# Patient Record
Sex: Male | Born: 1975 | Race: Black or African American | Hispanic: No | Marital: Single | Smoking: Former smoker
Health system: Southern US, Community
[De-identification: ages and names within clinical notes are randomized; demographics above are authoritative.]

## PROBLEM LIST (undated history)

## (undated) DIAGNOSIS — I10 Essential (primary) hypertension: Secondary | ICD-10-CM

## (undated) HISTORY — PX: NO PAST SURGERIES: SHX2092

---

## 2015-02-01 ENCOUNTER — Emergency Department
Admit: 2015-02-01 | Disposition: A | Discharge status: Home / Self Care | Payer: Self-pay | Admitting: Emergency Medicine

## 2015-02-01 LAB — CBC
HCT: 48.4 % (ref 40.0–52.0)
HGB: 16.3 g/dL (ref 13.0–18.0)
MCH: 30.2 pg (ref 26.0–34.0)
MCHC: 33.6 g/dL (ref 32.0–36.0)
MCV: 90 fL (ref 80–100)
Platelet: 189 10*3/uL (ref 150–440)
RBC: 5.39 10*6/uL (ref 4.40–5.90)
RDW: 12.9 % (ref 11.5–14.5)
WBC: 5.6 10*3/uL (ref 3.8–10.6)

## 2015-02-01 LAB — BASIC METABOLIC PANEL
Anion Gap: 8 (ref 7–16)
BUN: 14 mg/dL
CHLORIDE: 101 mmol/L
Calcium, Total: 9.3 mg/dL
Co2: 28 mmol/L
Creatinine: 1.03 mg/dL
EGFR (African American): >60
Glucose: 118 mg/dL — ABNORMAL HIGH
Potassium: 3.1 mmol/L — ABNORMAL LOW
SODIUM: 137 mmol/L

## 2015-02-01 LAB — TROPONIN I

## 2018-02-18 ENCOUNTER — Ambulatory Visit: Payer: Self-pay | Admitting: Urology

## 2019-05-10 ENCOUNTER — Encounter: Payer: Self-pay | Admitting: Emergency Medicine

## 2019-05-10 ENCOUNTER — Other Ambulatory Visit: Payer: Self-pay

## 2019-05-10 ENCOUNTER — Ambulatory Visit
Admission: EM | Admit: 2019-05-10 | Discharge: 2019-05-10 | Disposition: A | Discharge status: Home / Self Care | Payer: BC Managed Care – PPO | Attending: Family Medicine | Admitting: Family Medicine

## 2019-05-10 DIAGNOSIS — R0602 Shortness of breath: Secondary | ICD-10-CM

## 2019-05-10 DIAGNOSIS — I10 Essential (primary) hypertension: Secondary | ICD-10-CM | POA: Diagnosis not present

## 2019-05-10 HISTORY — DX: Essential (primary) hypertension: I10

## 2019-05-10 NOTE — Discharge Instructions (Signed)
Follow up as needed if symptoms recur Stop Sudafed decongestant

## 2019-05-10 NOTE — ED Triage Notes (Signed)
Pt states that he had a Tdap yesterday at his CPE. He woke up about 2:30 this morning SOB, arm swollen and aching. He is not having any shortness of breath now and had none since this morning. Denies chest pain.

## 2019-05-10 NOTE — ED Provider Notes (Signed)
[facility]    CSN: [id number] Arrival date & time: 05/10/19  1310     History   Chief Complaint Chief Complaint  Patient presents with  . Arm Pain    HPI Alvin Nichols is a 43 y.o. male.   43 yo male with a c/o an episode of shortness of breath last night. States shortness of breath is resolved and feels ok now. States he received a Tdap vaccine yesterday and wanted to make sure it was not a reaction to that. Denies any fevers, chills, chest pains, palpitations, swelling, hives/rash.    Arm Pain    Past Medical History:  Diagnosis Date  . Hypertension     There are no active problems to display for this patient.   Past Surgical History:  Procedure Laterality Date  . NO PAST SURGERIES         Home Medications    Prior to Admission medications   Medication Sig Start Date End Date Taking? Authorizing Provider  amLODipine (NORVASC) 10 MG tablet Take 10 mg by mouth daily. 04/11/19  Yes [provider]  fexofenadine-pseudoephedrine (ALLEGRA-D) 60-120 MG 12 hr tablet Take by mouth. 05/09/19 05/08/20 Yes [provider]  hydrochlorothiazide (HYDRODIURIL) 12.5 MG tablet Take 12.5 mg by mouth daily. 04/26/19  Yes [provider]  Multiple Vitamin (MULTI-VITAMIN) tablet Take by mouth.   Yes [provider]    Family History Family History  Problem Relation Age of Onset  . Healthy Mother   . Heart attack Father     Social History Social History   Tobacco Use  . Smoking status: Former Research scientist (life sciences)  . Smokeless tobacco: Never Used  Substance Use Topics  . Alcohol use: Yes  . Drug use: Not Currently     Allergies   Patient has no known allergies.   Review of Systems Review of Systems   Physical Exam Triage Vital Signs ED Triage Vitals  Enc Vitals Group     BP 05/10/19 1324 (!) 165/96     Pulse Rate 05/10/19 1324 82     Resp 05/10/19 1324 18     Temp 05/10/19 1324 98.1 F (36.7 C)     Temp Source 05/10/19  1324 Oral     SpO2 05/10/19 1324 100 %     Weight 05/10/19 1320 257 lb (116.6 kg)     Height 05/10/19 1320 6' (1.829 m)     Head Circumference --      Peak Flow --      Pain Score 05/10/19 1320 0     Pain Loc --      Pain Edu? --      Excl. in [place]? --    No data found.  Updated Vital Signs BP (!) 165/96 (BP Location: Left Arm)   Pulse 82   Temp 98.1 F (36.7 C) (Oral)   Resp 18   Ht 6' (1.829 m)   Wt 116.6 kg   SpO2 100%   BMI 34.86 kg/m   Visual Acuity Right Eye Distance:   Left Eye Distance:   Bilateral Distance:    Right Eye Near:   Left Eye Near:    Bilateral Near:     Physical Exam Vitals signs and nursing note reviewed.  Constitutional:      General: He is not in acute distress.    Appearance: He is not ill-appearing, toxic-appearing or diaphoretic.  Cardiovascular:     Rate and Rhythm: Normal rate.     Pulses:  Normal pulses.     Heart sounds: Normal heart sounds.  Pulmonary:     Effort: Pulmonary effort is normal. No respiratory distress.     Breath sounds: Normal breath sounds. No stridor. No wheezing, rhonchi or rales.  Skin:    Comments: Mild tenderness and mild edema at right upper arm injection site; extremity neurovascularly intact  Neurological:     Mental Status: He is alert.      UC Treatments / Results  Labs (all labs ordered are listed, but only abnormal results are displayed) Labs Reviewed - No data to display  EKG   Radiology No results found.  Procedures Procedures (including critical care time)  Medications Ordered in UC Medications - No data to display  Initial Impression / Assessment and Plan / UC Course  I have reviewed the triage vital signs and the nursing notes.  Pertinent labs & imaging results that were available during my care of the patient were reviewed by me and considered in my medical decision making (see chart for details).      Final Clinical Impressions(s) / UC Diagnoses   Final diagnoses:   Shortness of breath  Essential hypertension     Discharge Instructions     Follow up as needed if symptoms recur Stop Sudafed decongestant    ED Prescriptions    None     1. diagnosis reviewed with patien 3. Recommend supportive treatment as above 3. Follow-up prn if symptoms worsen or don't improve   Controlled Substance Prescriptions [place] Controlled Substance Registry consulted? Not Applicable   Payton Mccallumonty, Illyana Schorsch, MD 05/10/19 1520

## 2020-01-08 ENCOUNTER — Ambulatory Visit: Payer: 59 | Attending: Internal Medicine

## 2020-01-08 DIAGNOSIS — Z23 Encounter for immunization: Secondary | ICD-10-CM

## 2020-01-08 NOTE — Progress Notes (Signed)
   Covid-19 Vaccination Clinic  Name:  Alvin Nichols    MRN: [id number] DOB: [birth year]  01/08/2020  [name] was observed post Covid-19 immunization for 15 minutes without incident. He was provided with Vaccine Information Sheet and instruction to access the V-Safe system.   [name] was instructed to call 911 with any severe reactions post vaccine: Marland Kitchen Difficulty breathing  . Swelling of face and throat  . A fast heartbeat  . A bad rash all over body  . Dizziness and weakness   Immunizations Administered    Name Date Dose VIS Date Route   Pfizer COVID-19 Vaccine 01/08/2020  2:31 PM 0.3 mL 09/22/2019 Intramuscular   Manufacturer: [employer], [employer]   Lot: [id number]   NDC: [address]-9

## 2020-02-06 ENCOUNTER — Ambulatory Visit: Payer: 59 | Attending: Internal Medicine

## 2020-02-06 DIAGNOSIS — Z23 Encounter for immunization: Secondary | ICD-10-CM

## 2020-02-06 NOTE — Progress Notes (Signed)
   Covid-19 Vaccination Clinic  Name:  BIRD SWETZ    MRN: [id number] DOB: December 11, 1975  02/06/2020  Mr. Lancour was observed post Covid-19 immunization for 15 minutes without incident. He was provided with Vaccine Information Sheet and instruction to access the V-Safe system.   Mr. Kier was instructed to call 911 with any severe reactions post vaccine: Marland Kitchen Difficulty breathing  . Swelling of face and throat  . A fast heartbeat  . A bad rash all over body  . Dizziness and weakness   Immunizations Administered    Name Date Dose VIS Date Route   Pfizer COVID-19 Vaccine 02/06/2020 11:02 AM 0.3 mL 12/06/2018 Intramuscular   Manufacturer: [employer], [employer]   Lot: [id number]   NDC: [address]-6

## 2021-11-11 ENCOUNTER — Other Ambulatory Visit: Payer: Self-pay | Admitting: Physician Assistant

## 2021-11-11 ENCOUNTER — Other Ambulatory Visit (HOSPITAL_COMMUNITY): Payer: Self-pay | Admitting: Physician Assistant

## 2021-11-11 DIAGNOSIS — M2391 Unspecified internal derangement of right knee: Secondary | ICD-10-CM

## 2021-11-28 ENCOUNTER — Ambulatory Visit
Admission: RE | Admit: 2021-11-28 | Discharge: 2021-11-28 | Disposition: A | Discharge status: Home / Self Care | Payer: 59 | Source: Ambulatory Visit | Attending: Physician Assistant | Admitting: Physician Assistant

## 2021-11-28 ENCOUNTER — Other Ambulatory Visit: Payer: Self-pay

## 2021-11-28 DIAGNOSIS — M2391 Unspecified internal derangement of right knee: Secondary | ICD-10-CM | POA: Diagnosis not present

## 2023-06-18 IMAGING — MR MR KNEE*R* W/O CM
6 series · 40 of 40 positions shown · non-contrast
Comparison: None.

CLINICAL DATA: Right knee pain and swelling.

EXAM:
MRI OF THE RIGHT KNEE WITHOUT CONTRAST
TECHNIQUE: Multiplanar, multisequence MR imaging of the knee was performed. No
intravenous contrast was administered.

[Series 8: T2 fat-sat · axial · right · 4.0mm · 0.50mm/px · z∈[-59,+62]mm · 5 of 26 slices shown (1 of 3)]
[im 1/26]
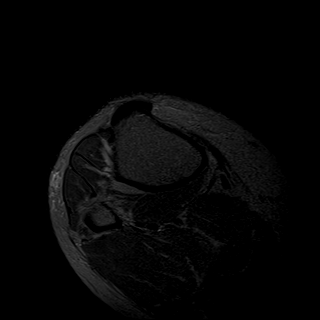
[im 7/26]
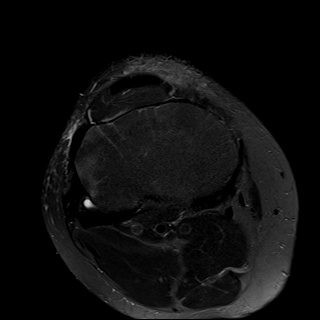
[im 13/26]
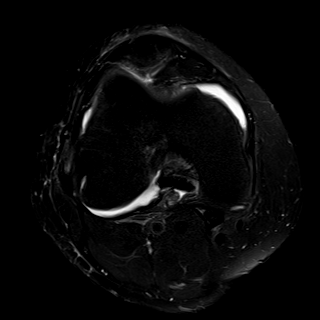
[im 19/26]
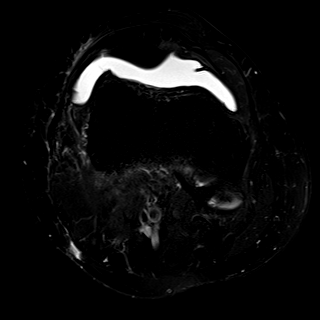
[im 26/26]
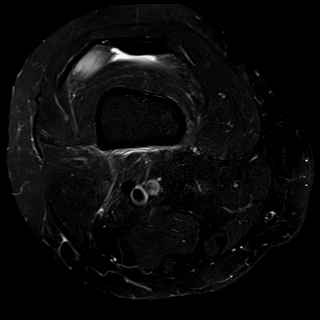

[Series 9: T2 fat-sat · coronal · right · 4.0mm · 0.59mm/px · 7 of 30 slices shown (2 of 3)]
[im 1/30]
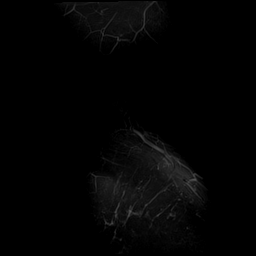
[im 5/30]
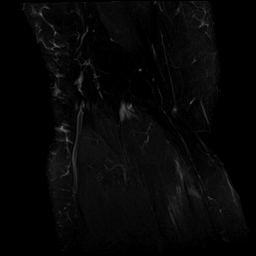
[im 10/30]
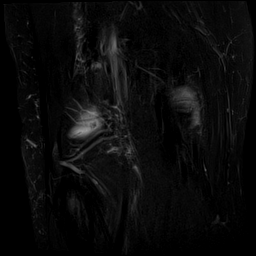
[im 15/30]
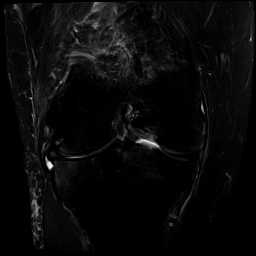
[im 20/30]
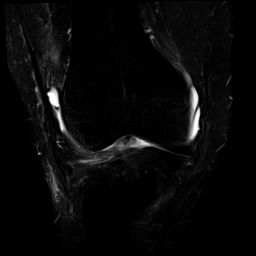
[im 25/30]
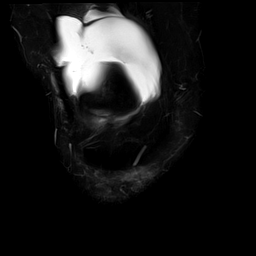
[im 30/30]
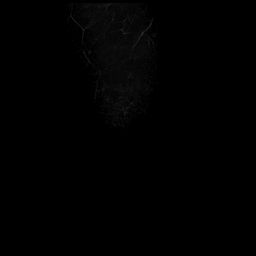

[Series 10: T1 · coronal · right · 4.0mm · 0.59mm/px · 7 of 30 slices shown]
[im 1/30]
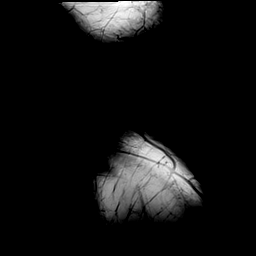
[im 5/30]
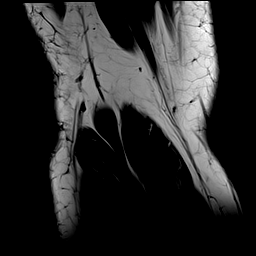
[im 10/30]
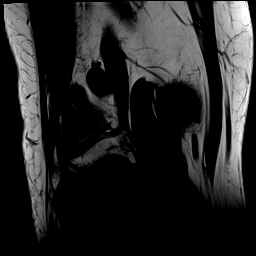
[im 15/30]
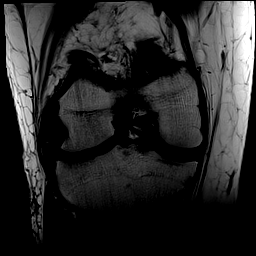
[im 20/30]
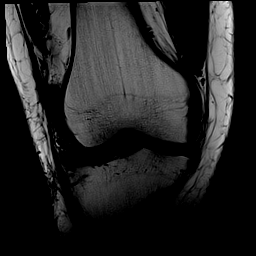
[im 25/30]
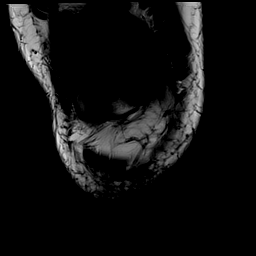
[im 30/30]
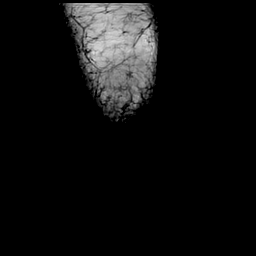

[Series 11: PD fat-sat · coronal · right · 4.0mm · 0.59mm/px · 7 of 30 slices shown (1 of 2)]
[im 1/30]
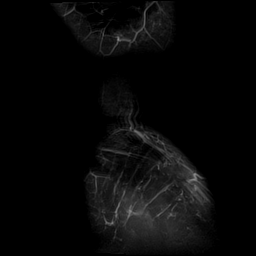
[im 5/30]
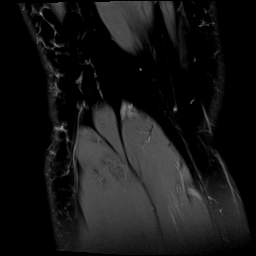
[im 10/30]
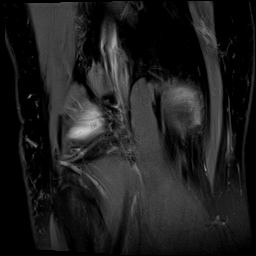
[im 15/30]
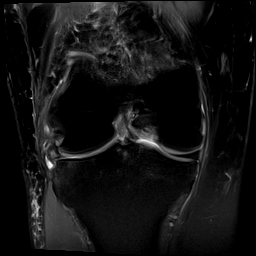
[im 20/30]
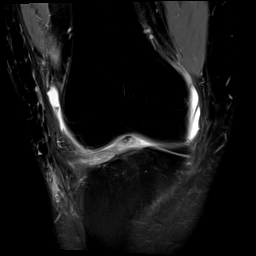
[im 25/30]
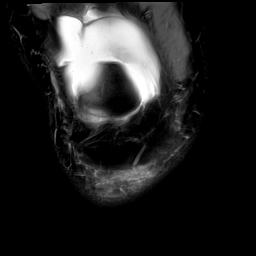
[im 30/30]
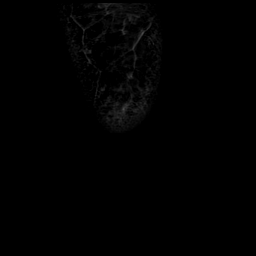

[Series 12: PD fat-sat · sagittal · right · 3.0mm · 0.59mm/px · 7 of 30 slices shown (2 of 2)]
[im 1/30]
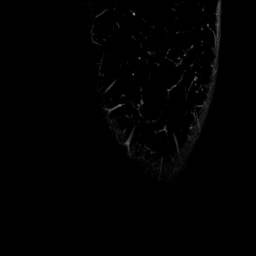
[im 5/30]
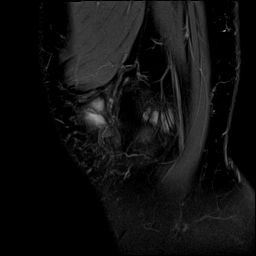
[im 10/30]
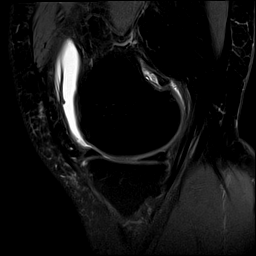
[im 15/30]
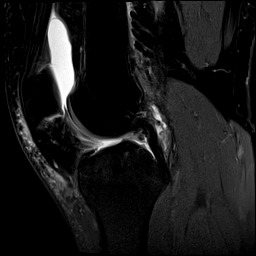
[im 20/30]
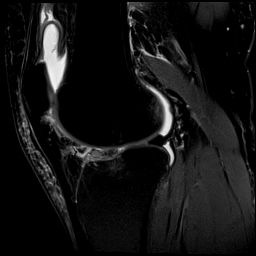
[im 25/30]
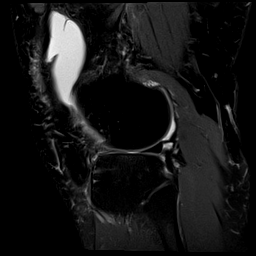
[im 30/30]
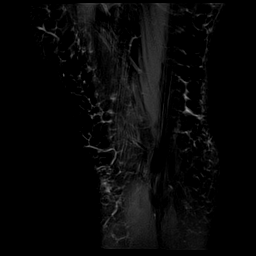

[Series 13: T2 fat-sat · sagittal · right · 3.0mm · 0.59mm/px · 7 of 32 slices shown (3 of 3)]
[im 1/32]
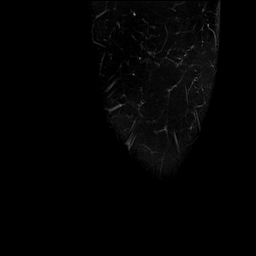
[im 6/32]
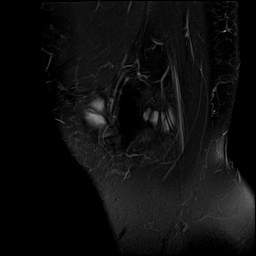
[im 11/32]
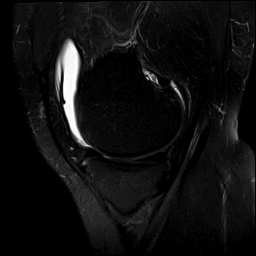
[im 16/32]
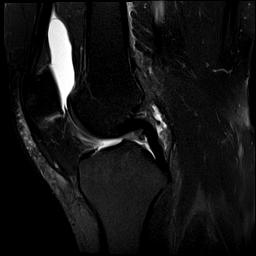
[im 21/32]
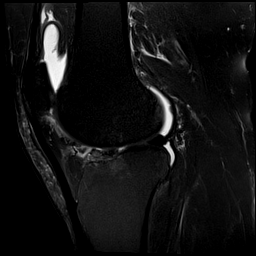
[im 26/32]
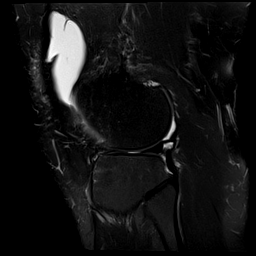
[im 32/32]
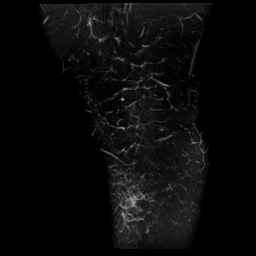

[40 of 40 positions shown; findings below may reference images not displayed]

FINDINGS: MENISCI

Medial: Intact.

Lateral: Maceration of the body and anterior horn of the lateral
meniscus.

LIGAMENTS

Cruciates: ACL and PCL are intact.

Collaterals: Medial collateral ligament is intact. Lateral
collateral ligament complex is intact.

CARTILAGE

Patellofemoral:  Cartilage fissuring of the lateral patellar facet.

Medial: Mild partial-thickness cartilage loss of the medial
femorotibial compartment.

Lateral: High-grade partial-thickness cartilage loss of the lateral
femorotibial compartment with subchondral reactive marrow edema in
the lateral tibial plateau.

JOINT: Large joint effusion. Normal Marc Wilhelm Johanns. No plical
thickening.

POPLITEAL FOSSA: Popliteus tendon is intact. No Baker's cyst.

EXTENSOR MECHANISM: Intact quadriceps tendon. Intact patellar
tendon. Intact lateral patellar retinaculum. Intact medial patellar
retinaculum. Intact MPFL.

BONES: No aggressive osseous lesion. No fracture or dislocation.

Other: No fluid collection or hematoma. Muscles are normal.
IMPRESSION: 1. Maceration of the body and anterior horn of the lateral meniscus.
2. Tricompartmental cartilage abnormalities as described above most
severe in the lateral femorotibial compartment.
3. Large joint effusion.

## 2023-07-09 ENCOUNTER — Ambulatory Visit: Payer: 59

## 2023-07-09 DIAGNOSIS — Z1211 Encounter for screening for malignant neoplasm of colon: Secondary | ICD-10-CM | POA: Diagnosis present

## 2023-07-09 DIAGNOSIS — K640 First degree hemorrhoids: Secondary | ICD-10-CM | POA: Diagnosis not present
